# Patient Record
Sex: Female | Born: 1960 | Race: Asian | Hispanic: No | Smoking: Never smoker
Health system: Southern US, Community
[De-identification: ages and names within clinical notes are randomized; demographics above are authoritative.]

## PROBLEM LIST (undated history)

## (undated) DIAGNOSIS — Z0000 Encounter for general adult medical examination without abnormal findings: Secondary | ICD-10-CM

## (undated) DIAGNOSIS — M1990 Unspecified osteoarthritis, unspecified site: Secondary | ICD-10-CM

## (undated) DIAGNOSIS — E785 Hyperlipidemia, unspecified: Secondary | ICD-10-CM

## (undated) DIAGNOSIS — R1013 Epigastric pain: Secondary | ICD-10-CM

## (undated) HISTORY — DX: Hyperlipidemia, unspecified: E78.5

## (undated) HISTORY — DX: Unspecified osteoarthritis, unspecified site: M19.90

## (undated) HISTORY — PX: COSMETIC SURGERY: SHX468

## (undated) HISTORY — DX: Encounter for general adult medical examination without abnormal findings: Z00.00

## (undated) HISTORY — PX: OOPHORECTOMY: SHX86

## (undated) HISTORY — PX: BREAST SURGERY: SHX581

## (undated) HISTORY — DX: Epigastric pain: R10.13

---

## 1998-12-02 HISTORY — PX: BREAST SURGERY: SHX581

## 2010-07-10 ENCOUNTER — Ambulatory Visit: Payer: Self-pay | Admitting: Family Medicine

## 2010-07-10 DIAGNOSIS — M62838 Other muscle spasm: Secondary | ICD-10-CM | POA: Insufficient documentation

## 2010-07-10 DIAGNOSIS — M129 Arthropathy, unspecified: Secondary | ICD-10-CM | POA: Insufficient documentation

## 2010-07-10 DIAGNOSIS — M479 Spondylosis, unspecified: Secondary | ICD-10-CM | POA: Insufficient documentation

## 2010-07-11 ENCOUNTER — Ambulatory Visit: Payer: Self-pay | Admitting: Family Medicine

## 2010-07-11 LAB — CONVERTED CEMR LAB
Basophils Absolute: 0.1 10*3/uL (ref 0.0–0.1)
Basophils Relative: 1.2 % (ref 0.0–3.0)
Calcium: 9.4 mg/dL (ref 8.4–10.5)
Chloride: 100 meq/L (ref 96–112)
Creatinine, Ser: 0.5 mg/dL (ref 0.4–1.2)
Eosinophils Relative: 2.8 % (ref 0.0–5.0)
Lymphocytes Relative: 33.3 % (ref 12.0–46.0)
Lymphs Abs: 1.6 10*3/uL (ref 0.7–4.0)
MCV: 91.6 fL (ref 78.0–100.0)
Monocytes Absolute: 0.4 10*3/uL (ref 0.1–1.0)
Monocytes Relative: 8.8 % (ref 3.0–12.0)
Potassium: 4.1 meq/L (ref 3.5–5.1)
RBC: 3.96 M/uL (ref 3.87–5.11)
Sed Rate: 23 mm/hr — ABNORMAL HIGH (ref 0–22)
WBC: 4.7 10*3/uL (ref 4.5–10.5)
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
## 2011-08-02 ENCOUNTER — Other Ambulatory Visit: Payer: Self-pay | Admitting: Family Medicine

## 2014-11-07 ENCOUNTER — Other Ambulatory Visit: Payer: Self-pay | Admitting: Gastroenterology

## 2014-11-07 DIAGNOSIS — K219 Gastro-esophageal reflux disease without esophagitis: Secondary | ICD-10-CM

## 2014-11-11 ENCOUNTER — Encounter (INDEPENDENT_AMBULATORY_CARE_PROVIDER_SITE_OTHER): Payer: Self-pay

## 2014-11-11 ENCOUNTER — Ambulatory Visit
Admission: RE | Admit: 2014-11-11 | Discharge: 2014-11-11 | Disposition: A | Discharge status: Home / Self Care | Payer: BC Managed Care – PPO | Source: Ambulatory Visit | Attending: Gastroenterology | Admitting: Gastroenterology

## 2014-11-11 DIAGNOSIS — K219 Gastro-esophageal reflux disease without esophagitis: Secondary | ICD-10-CM

## 2015-07-06 ENCOUNTER — Other Ambulatory Visit: Payer: Self-pay | Admitting: Gastroenterology

## 2015-07-06 DIAGNOSIS — K7689 Other specified diseases of liver: Secondary | ICD-10-CM

## 2015-07-12 ENCOUNTER — Ambulatory Visit
Admission: RE | Admit: 2015-07-12 | Discharge: 2015-07-12 | Disposition: A | Discharge status: Home / Self Care | Payer: BLUE CROSS/BLUE SHIELD | Source: Ambulatory Visit | Attending: Gastroenterology | Admitting: Gastroenterology

## 2015-07-12 DIAGNOSIS — K7689 Other specified diseases of liver: Secondary | ICD-10-CM

## 2015-07-18 ENCOUNTER — Other Ambulatory Visit: Payer: Self-pay | Admitting: Gastroenterology

## 2015-07-18 DIAGNOSIS — K7689 Other specified diseases of liver: Secondary | ICD-10-CM

## 2015-07-26 ENCOUNTER — Ambulatory Visit
Admission: RE | Admit: 2015-07-26 | Discharge: 2015-07-26 | Disposition: A | Discharge status: Home / Self Care | Payer: BLUE CROSS/BLUE SHIELD | Source: Ambulatory Visit | Attending: Gastroenterology | Admitting: Gastroenterology

## 2015-07-26 DIAGNOSIS — K7689 Other specified diseases of liver: Secondary | ICD-10-CM

## 2015-07-26 MED ORDER — GADOBENATE DIMEGLUMINE 529 MG/ML IV SOLN
10.0000 mL | Freq: Once | INTRAVENOUS | Status: AC | PRN
  Administered 2015-07-26: 10 mL via INTRAVENOUS

## 2016-11-05 IMAGING — US US ABDOMEN COMPLETE
1 series · 13 of 25 positions shown · non-contrast
Comparison: 11/11/2014

CLINICAL DATA: Evaluate hepatic cyst. Patient complains of gas and
belching.

EXAM:
ULTRASOUND ABDOMEN COMPLETE

[Series 1: us abdomen complete · 0.28mm/px · 13 of 84 slices shown]
[im 1/84]
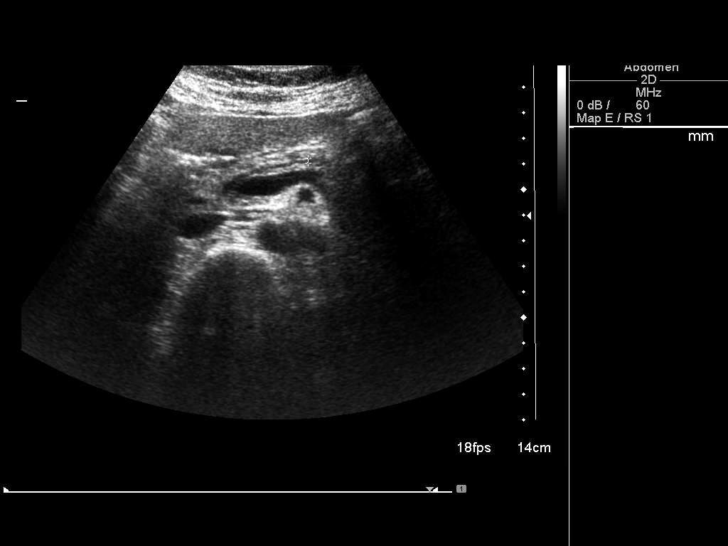
[im 7/84]
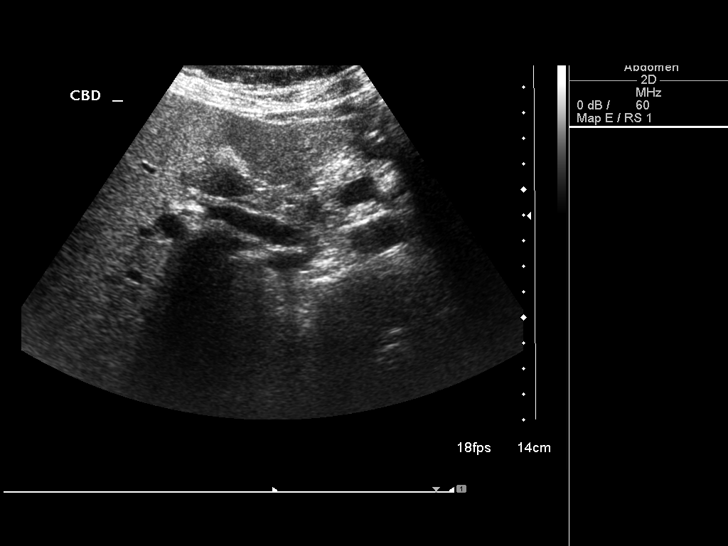
[im 14/84]
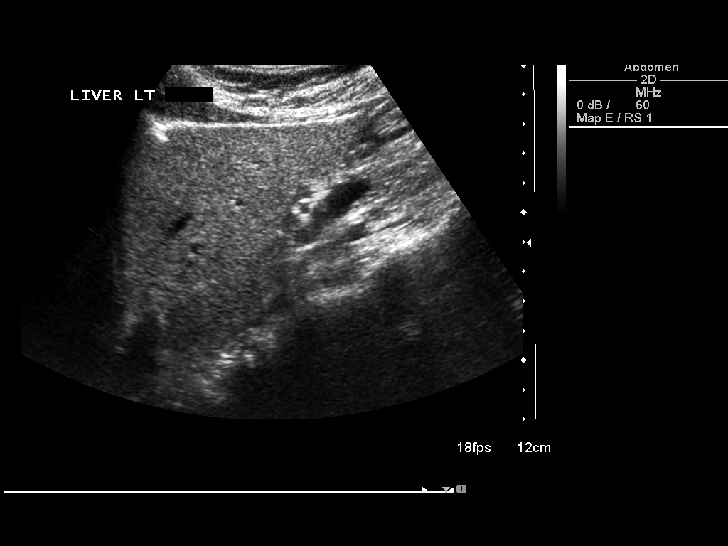
[im 21/84]
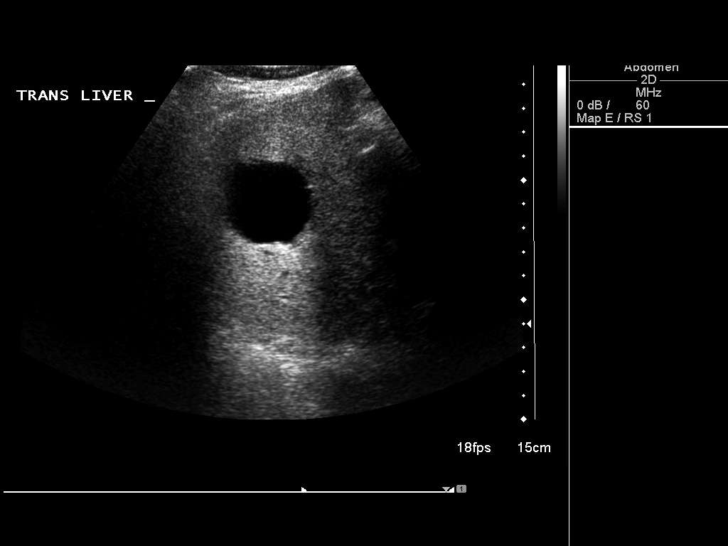
[im 28/84]
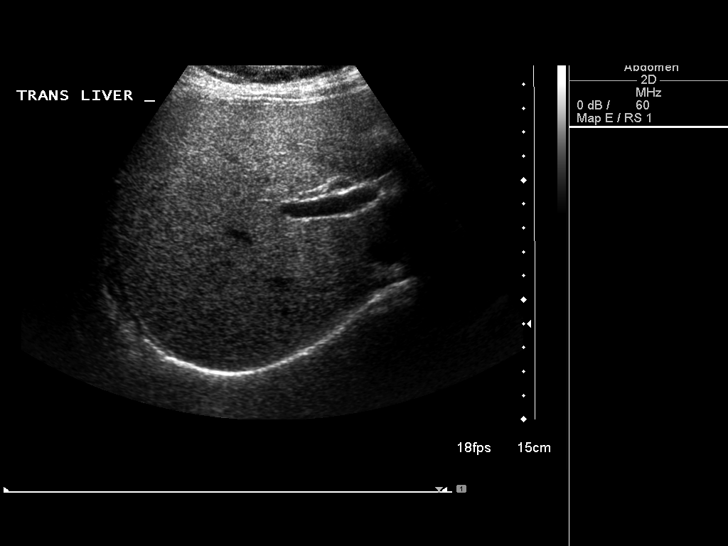
[im 35/84]
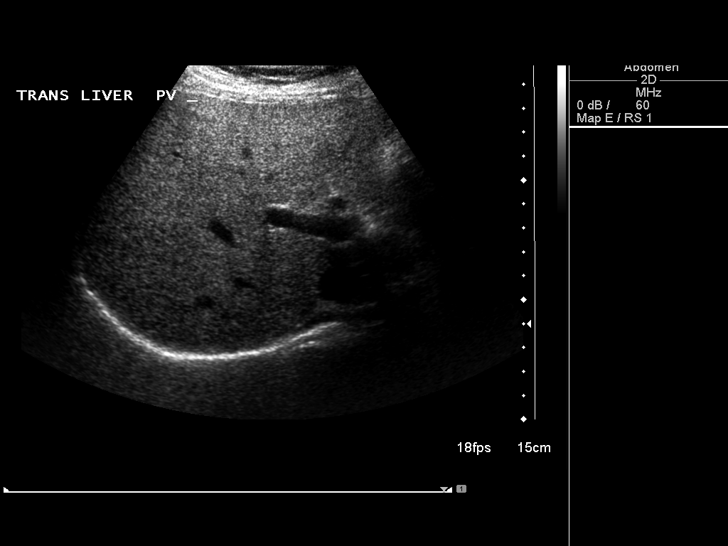
[im 42/84]
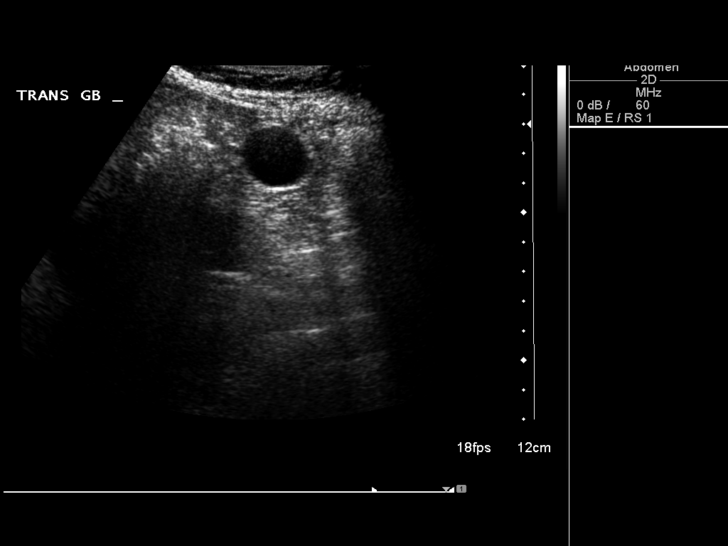
[im 49/84]
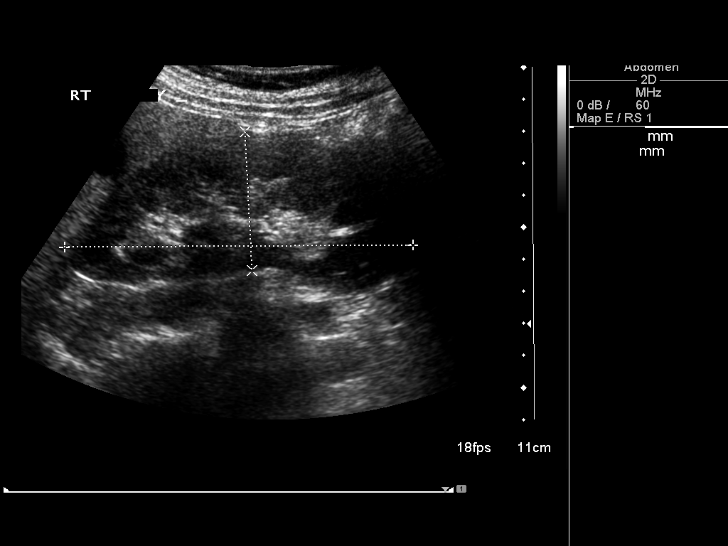
[im 56/84]
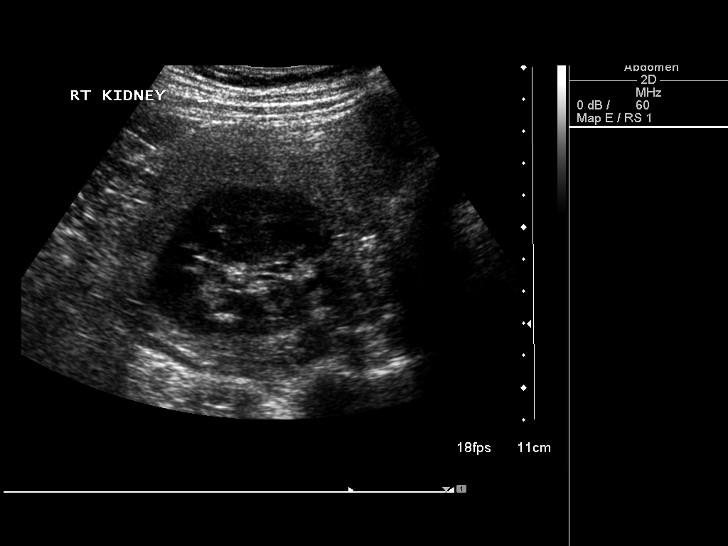
[im 63/84]
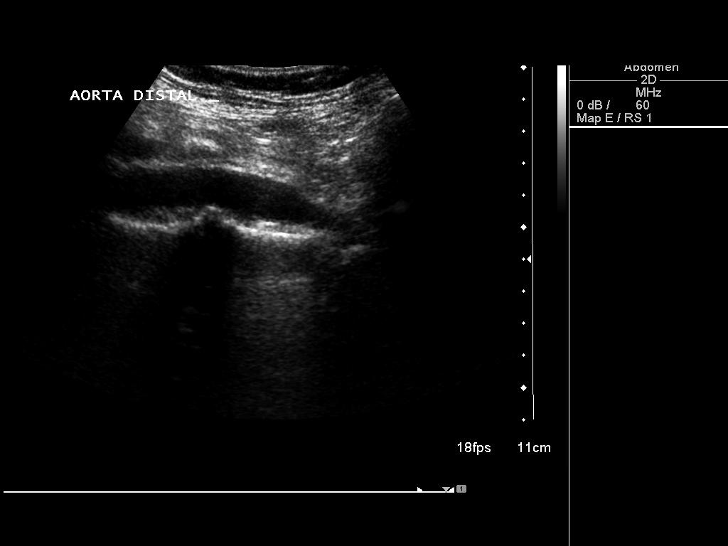
[im 70/84]
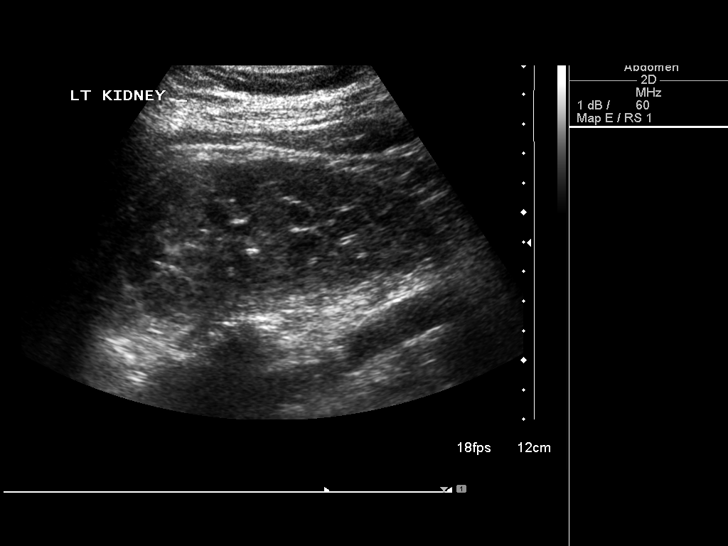
[im 77/84]
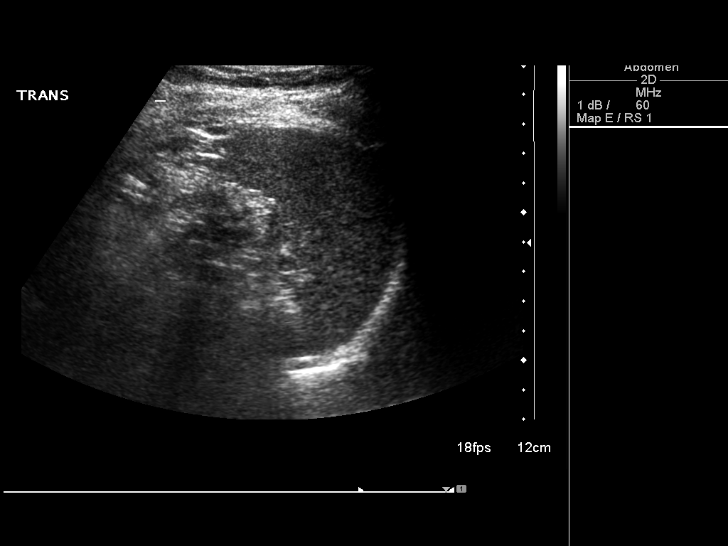
[im 84/84]
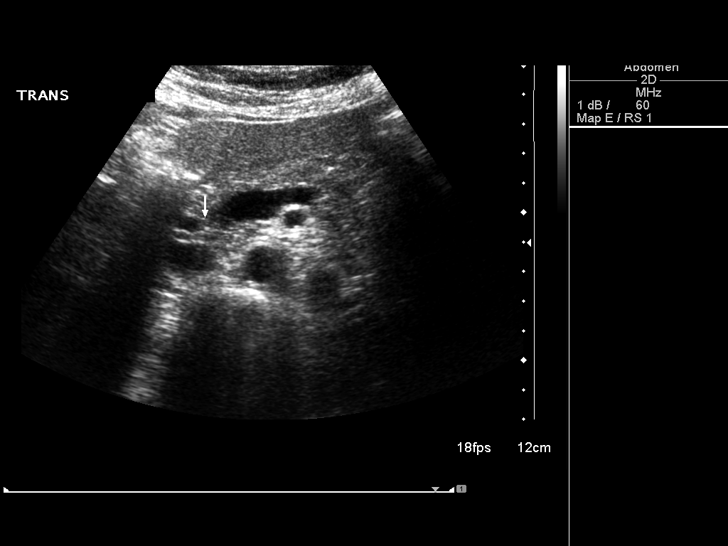

[13 of 25 positions shown; findings below may reference images not displayed]

FINDINGS: Gallbladder: No gallstones or wall thickening visualized. No
sonographic Murphy sign noted.

Common bile duct: Diameter: 0.9 cm. There is concern for some
echogenic material in the distal common bile duct.

Liver: Again noted is an anechoic structure in the right hepatic
lobe measuring 4.4 x 3.6 x 3.8 cm. Findings consistent with a simple
hepatic cyst. No other liver lesions. No significant intrahepatic
biliary dilatation. Main portal vein is patent.

IVC: No abnormality visualized.

Pancreas: Pancreatic duct appears to be prominent near the
pancreatic head measuring roughly 0.4 cm. No gross abnormality to
the visualized pancreatic parenchyma.

Spleen: Size and appearance within normal limits.

Right Kidney: Length: 10.9 cm. Echogenicity within normal limits. No
mass or hydronephrosis visualized.

Left Kidney: Length: 10.8 cm. Echogenicity within normal limits. No
mass or hydronephrosis visualized.

Abdominal aorta: No aneurysm visualized. Atherosclerotic disease in
the distal abdominal aorta.

Other findings: None.
IMPRESSION: No significant change in the right hepatic cyst.

Mild dilatation of the extrahepatic biliary system and pancreatic
duct. There is concern for echogenic material in the distal common
bile duct which could represent choledocholithiasis. These findings
could be better characterized with an MRI of the abdomen with MRCP.

## 2017-09-02 ENCOUNTER — Ambulatory Visit (INDEPENDENT_AMBULATORY_CARE_PROVIDER_SITE_OTHER): Payer: BLUE CROSS/BLUE SHIELD | Admitting: Family Medicine

## 2017-09-02 ENCOUNTER — Encounter: Payer: Self-pay | Admitting: Family Medicine

## 2017-09-02 VITALS — BP 118/71 | HR 68 | Temp 97.9°F | Resp 18 | Ht 62.0 in | Wt 122.0 lb

## 2017-09-02 DIAGNOSIS — E785 Hyperlipidemia, unspecified: Secondary | ICD-10-CM

## 2017-09-02 DIAGNOSIS — M479 Spondylosis, unspecified: Secondary | ICD-10-CM

## 2017-09-02 DIAGNOSIS — Z0000 Encounter for general adult medical examination without abnormal findings: Secondary | ICD-10-CM | POA: Diagnosis not present

## 2017-09-02 DIAGNOSIS — R51 Headache: Secondary | ICD-10-CM | POA: Diagnosis not present

## 2017-09-02 DIAGNOSIS — R079 Chest pain, unspecified: Secondary | ICD-10-CM | POA: Diagnosis not present

## 2017-09-02 DIAGNOSIS — K5900 Constipation, unspecified: Secondary | ICD-10-CM | POA: Diagnosis not present

## 2017-09-02 DIAGNOSIS — R1013 Epigastric pain: Secondary | ICD-10-CM

## 2017-09-02 DIAGNOSIS — R519 Headache, unspecified: Secondary | ICD-10-CM

## 2017-09-02 HISTORY — DX: Epigastric pain: R10.13

## 2017-09-02 MED ORDER — RANITIDINE HCL 300 MG PO CAPS: 300.0000 mg | ORAL_CAPSULE | Freq: Every evening | ORAL | 2 refills | Status: AC

## 2017-09-02 MED ORDER — TIZANIDINE HCL 4 MG PO TABS
4.0000 mg | ORAL_TABLET | Freq: Every evening | ORAL | 1 refills | Status: DC | PRN
Start: 2017-09-02 — End: 2017-09-19
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
## 2017-09-02 NOTE — Patient Instructions (Addendum)
Encouraged increased hydration and fiber in diet. Daily probiotics. If bowels not moving can use MOM 2 tbls po in 4 oz of warm prune juice by mouth every 2-3 days. If no results then repeat in 4 hours with  Dulcolax suppository pr, may repeat again in 4 more hours as needed. Seek care if symptoms worsen. Consider daily Miralax and/or Dulcolax if symptoms persist.    Helicobacter Pylori Infection Helicobacter pylori infection is an infection in the stomach that is caused by the Helicobacter pylori (H. pylori) bacteria. This type of bacteria often lives in the lining of the stomach. The infection can cause ulcers and irritation (gastritis) in some people. It is the most common cause of ulcers in the stomach (gastric ulcer) and in the upper part of the intestine (duodenal ulcer). Having this infection may also increase the risk of stomach cancer and a type of white blood cell cancer (lymphoma) that affects the stomach. What are the causes? H. pylori is a type of bacteria that is often found in the stomachs of healthy people. The bacteria may be passed from person to person through contact with stool or saliva. It is not known why some people develop ulcers, gastritis, or cancer from the infection. What increases the risk? This condition is more likely to develop in people who:  Have family members with the infection.  Live with many other people, such as in a dormitory.  Are of African, Hispanic, or Asian descent.  What are the signs or symptoms? Most people with this infection do not have symptoms. If you do have symptoms, they may include:  Heartburn.  Stomach pain.  Nausea.  Vomiting.  Blood-tinged vomit.  Loss of appetite.  Bad breath.  How is this diagnosed? This condition may be diagnosed based on your symptoms, a physical exam, and various tests. Tests may include:  Blood tests or stool tests to check for the proteins (antibodies) that your body may produce in response to the  bacteria. These tests are the best way to confirm the diagnosis.  A breath test to check for the type of gas that the H. pylori bacteria release after breaking down a substance called urea. For the test, you are asked to drink urea. This test is often done after treatment in order to find out if the treatment worked.  A procedure in which a thin, flexible tube with a tiny camera at the end is placed into your stomach and upper intestine (upper endoscopy). Your health care provider may also take tissue samples (biopsy) to test for H. pylori and cancer.  How is this treated? Treatment for this condition usually involves taking a combination of medicines (triple therapy) for a couple of weeks. Triple therapy includes one medicine to reduce the acid in your stomach and two types of antibiotic medicines. Many drug combinations have been approved for treatment. Treatment usually kills the H. pylori and reduces your risk of cancer. You may need to be tested for H. pylori again after treatment. In some cases, the treatment may need to be repeated. Follow these instructions at home:  Take over-the-counter and prescription medicines only as told by your health care provider.  Take your antibiotics as told by your health care provider. Do not stop taking the antibiotics even if you start to feel better.  You can do all your usual activities and eat what you usually do.  Take steps to prevent future infections: ? Wash your hands often. ? Make sure the food  you eat has been properly prepared. ? Drink water only from clean sources.  Keep all follow-up visits as told by your health care provider. This is important. Contact a health care provider if:  Your symptoms do not get better.  Your symptoms return after treatment. This information is not intended to replace advice given to you by your health care provider. Make sure you discuss any questions you have with your health care provider. Document  Released: 03/11/2016 Document Revised: 04/25/2016 Document Reviewed: 11/30/2014 Elsevier Interactive Patient Education  2018 [employer].

## 2017-09-03 LAB — COMPREHENSIVE METABOLIC PANEL
ALBUMIN: 4.4 g/dL (ref 3.5–5.2)
ALK PHOS: 54 U/L (ref 39–117)
ALT: 22 U/L (ref 0–35)
AST: 16 U/L (ref 0–37)
BUN: 13 mg/dL (ref 6–23)
CALCIUM: 9.5 mg/dL (ref 8.4–10.5)
CO2: 31 mEq/L (ref 19–32)
Chloride: 102 mEq/L (ref 96–112)
Creatinine, Ser: 0.7 mg/dL (ref 0.40–1.20)
GFR: 91.12 mL/min (ref >60.00)
Glucose, Bld: 84 mg/dL (ref 70–99)
POTASSIUM: 3.8 meq/L (ref 3.5–5.1)
Sodium: 140 mEq/L (ref 135–145)
TOTAL PROTEIN: 8 g/dL (ref 6.0–8.3)
Total Bilirubin: 0.6 mg/dL (ref 0.2–1.2)

## 2017-09-03 LAB — LIPID PANEL
Cholesterol: 199 mg/dL (ref 0–200)
HDL: 47.4 mg/dL (ref >39.00)
LDL Cholesterol: 122 mg/dL — ABNORMAL HIGH (ref 0–99)
NonHDL: 151.13
TRIGLYCERIDES: 144 mg/dL (ref 0.0–149.0)
Total CHOL/HDL Ratio: 4
VLDL: 28.8 mg/dL (ref 0.0–40.0)

## 2017-09-03 LAB — CBC
HEMATOCRIT: 39.1 % (ref 36.0–46.0)
Hemoglobin: 13 g/dL (ref 12.0–15.0)
MCHC: 33.4 g/dL (ref 30.0–36.0)
MCV: 98.4 fl (ref 78.0–100.0)
Platelets: 267 10*3/uL (ref 150.0–400.0)
RBC: 3.97 Mil/uL (ref 3.87–5.11)
RDW: 12.2 % (ref 11.5–15.5)
WBC: 7 10*3/uL (ref 4.0–10.5)

## 2017-09-03 LAB — TSH: TSH: 1.1 u[IU]/mL (ref 0.35–4.50)

## 2017-09-03 LAB — H. PYLORI ANTIBODY, IGG: H Pylori IgG: NEGATIVE

## 2017-09-03 LAB — T4, FREE: FREE T4: 1.01 ng/dL (ref 0.60–1.60)

## 2017-09-07 ENCOUNTER — Encounter: Payer: Self-pay | Admitting: Family Medicine

## 2017-09-07 DIAGNOSIS — Z0000 Encounter for general adult medical examination without abnormal findings: Secondary | ICD-10-CM

## 2017-09-07 DIAGNOSIS — E785 Hyperlipidemia, unspecified: Secondary | ICD-10-CM

## 2017-09-07 HISTORY — DX: Hyperlipidemia, unspecified: E78.5

## 2017-09-07 HISTORY — DX: Encounter for general adult medical examination without abnormal findings: Z00.00

## 2017-09-07 NOTE — Assessment & Plan Note (Signed)
Has had xrays done at the chiropractors office. will request copies.
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
## 2017-09-07 NOTE — Assessment & Plan Note (Signed)
Has seen Dr Marcelle Overlie of GYN for paps and [facility] will request records. Labs ordered today. Immunizations declined.

## 2017-09-07 NOTE — Assessment & Plan Note (Signed)
maintain heart healthy diet, increase exercise, avoid trans fats, consider a krill oil cap daily 
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
## 2017-09-09 ENCOUNTER — Ambulatory Visit (HOSPITAL_BASED_OUTPATIENT_CLINIC_OR_DEPARTMENT_OTHER)
Admission: RE | Admit: 2017-09-09 | Discharge: 2017-09-09 | Disposition: A | Discharge status: Home / Self Care | Payer: BLUE CROSS/BLUE SHIELD | Source: Ambulatory Visit | Attending: Family Medicine | Admitting: Family Medicine

## 2017-09-09 ENCOUNTER — Encounter: Payer: Self-pay | Admitting: Physician Assistant

## 2017-09-09 DIAGNOSIS — R079 Chest pain, unspecified: Secondary | ICD-10-CM | POA: Diagnosis present

## 2017-09-09 DIAGNOSIS — I700 Atherosclerosis of aorta: Secondary | ICD-10-CM | POA: Diagnosis not present

## 2017-09-19 ENCOUNTER — Encounter: Payer: Self-pay | Admitting: Physician Assistant

## 2017-09-19 ENCOUNTER — Ambulatory Visit (INDEPENDENT_AMBULATORY_CARE_PROVIDER_SITE_OTHER): Payer: BLUE CROSS/BLUE SHIELD | Admitting: Physician Assistant

## 2017-09-19 VITALS — BP 100/68 | HR 66 | Ht 62.0 in | Wt 122.1 lb

## 2017-09-19 DIAGNOSIS — R142 Eructation: Secondary | ICD-10-CM

## 2017-09-19 MED ORDER — OMEPRAZOLE 40 MG PO CPDR: 40.0000 mg | DELAYED_RELEASE_CAPSULE | ORAL | 3 refills | Status: AC

## 2017-09-19 NOTE — Patient Instructions (Signed)
We have sent the following medications to your pharmacy for you to pick up at your convenience:  Omeprazole 40 mg daily 30-60 minutes before breakfast.   

## 2017-09-19 NOTE — Progress Notes (Signed)
Reviewed and agree with initial management plan.  Micki Cassel T. Reham Slabaugh, MD FACG 
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
## 2017-11-13 ENCOUNTER — Ambulatory Visit: Payer: BLUE CROSS/BLUE SHIELD | Admitting: Family Medicine

## 2018-01-22 ENCOUNTER — Ambulatory Visit: Payer: BLUE CROSS/BLUE SHIELD | Admitting: Family Medicine

## 2018-01-22 ENCOUNTER — Telehealth: Payer: Self-pay

## 2018-01-22 NOTE — Telephone Encounter (Signed)
Medical Record Request form received from J. Olevia Perchesavid [place] Attorney at [place], form forwarded to [place], Research officer, political partypractice administrator for scanning/emailing.

## 2018-04-07 ENCOUNTER — Ambulatory Visit: Payer: BLUE CROSS/BLUE SHIELD | Attending: Orthopedic Surgery | Admitting: Physical Therapy

## 2018-04-07 ENCOUNTER — Encounter: Payer: Self-pay | Admitting: Physical Therapy

## 2018-04-07 DIAGNOSIS — M62838 Other muscle spasm: Secondary | ICD-10-CM | POA: Insufficient documentation

## 2018-04-07 DIAGNOSIS — M542 Cervicalgia: Secondary | ICD-10-CM | POA: Diagnosis not present
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
## 2018-04-14 ENCOUNTER — Ambulatory Visit: Payer: BLUE CROSS/BLUE SHIELD | Admitting: Physical Therapy

## 2018-04-14 DIAGNOSIS — M62838 Other muscle spasm: Secondary | ICD-10-CM

## 2018-04-14 DIAGNOSIS — M542 Cervicalgia: Secondary | ICD-10-CM | POA: Diagnosis not present
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
## 2018-04-16 ENCOUNTER — Ambulatory Visit: Payer: BLUE CROSS/BLUE SHIELD | Admitting: Physical Therapy

## 2018-04-16 ENCOUNTER — Other Ambulatory Visit: Payer: Self-pay

## 2018-04-16 DIAGNOSIS — M542 Cervicalgia: Secondary | ICD-10-CM | POA: Diagnosis not present

## 2018-04-16 DIAGNOSIS — M62838 Other muscle spasm: Secondary | ICD-10-CM
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
## 2018-04-21 ENCOUNTER — Ambulatory Visit: Payer: BLUE CROSS/BLUE SHIELD | Admitting: Physical Therapy

## 2018-04-21 ENCOUNTER — Encounter: Payer: Self-pay | Admitting: Physical Therapy

## 2018-04-21 DIAGNOSIS — M542 Cervicalgia: Secondary | ICD-10-CM | POA: Diagnosis not present

## 2018-04-21 DIAGNOSIS — M62838 Other muscle spasm: Secondary | ICD-10-CM
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
## 2018-04-23 ENCOUNTER — Ambulatory Visit: Payer: BLUE CROSS/BLUE SHIELD | Admitting: Physical Therapy

## 2018-04-23 DIAGNOSIS — M62838 Other muscle spasm: Secondary | ICD-10-CM

## 2018-04-23 DIAGNOSIS — M542 Cervicalgia: Secondary | ICD-10-CM | POA: Diagnosis not present
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
## 2018-04-29 ENCOUNTER — Encounter: Payer: Self-pay | Admitting: Physical Therapy

## 2018-04-29 ENCOUNTER — Ambulatory Visit: Payer: BLUE CROSS/BLUE SHIELD | Admitting: Physical Therapy

## 2018-04-29 DIAGNOSIS — M62838 Other muscle spasm: Secondary | ICD-10-CM

## 2018-04-29 DIAGNOSIS — M542 Cervicalgia: Secondary | ICD-10-CM
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
## 2018-05-05 ENCOUNTER — Ambulatory Visit: Payer: BLUE CROSS/BLUE SHIELD | Attending: Orthopedic Surgery | Admitting: Physical Therapy

## 2018-05-05 DIAGNOSIS — M542 Cervicalgia: Secondary | ICD-10-CM | POA: Insufficient documentation

## 2018-05-05 DIAGNOSIS — M62838 Other muscle spasm: Secondary | ICD-10-CM | POA: Diagnosis not present
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
## 2018-05-08 ENCOUNTER — Ambulatory Visit: Payer: BLUE CROSS/BLUE SHIELD | Admitting: Physical Therapy

## 2018-07-24 ENCOUNTER — Telehealth: Payer: Self-pay | Admitting: *Deleted

## 2018-07-24 NOTE — Telephone Encounter (Signed)
Received request for Medical Records from J. Olevia Perchesavid [place] [facility] [employer]; forwarded to [place] for email/scan/SLS 08/23
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
## 2020-01-28 ENCOUNTER — Ambulatory Visit: Payer: BLUE CROSS/BLUE SHIELD | Attending: Internal Medicine

## 2020-01-28 DIAGNOSIS — Z23 Encounter for immunization: Secondary | ICD-10-CM | POA: Insufficient documentation

## 2020-01-28 NOTE — Progress Notes (Signed)
   Covid-19 Vaccination Clinic  Name:  Karen Nicholson    MRN: [id number] DOB: 06-Dec-1960  01/28/2020  [name] was observed post Covid-19 immunization for 15 minutes without incidence. She was provided with Vaccine Information Sheet and instruction to access the V-Safe system.   [name] was instructed to call 911 with any severe reactions post vaccine: Marland Kitchen Difficulty breathing  . Swelling of your face and throat  . A fast heartbeat  . A bad rash all over your body  . Dizziness and weakness    Immunizations Administered    Name Date Dose VIS Date Route   Pfizer COVID-19 Vaccine 01/28/2020  8:25 AM 0.3 mL 11/12/2019 Intramuscular   Manufacturer: [employer], [employer]   Lot: [id number]   NDC: [address]-8

## 2020-02-19 ENCOUNTER — Ambulatory Visit: Payer: BLUE CROSS/BLUE SHIELD | Attending: Internal Medicine

## 2020-02-19 DIAGNOSIS — Z23 Encounter for immunization: Secondary | ICD-10-CM

## 2020-02-19 NOTE — Progress Notes (Signed)
   Covid-19 Vaccination Clinic  Name:  Karen Nicholson    MRN: [id number] DOB: [birth year]  02/19/2020  [name] was observed post Covid-19 immunization for 15 minutes without incident. She was provided with Vaccine Information Sheet and instruction to access the V-Safe system.   [name] was instructed to call 911 with any severe reactions post vaccine: Marland Kitchen Difficulty breathing  . Swelling of face and throat  . A fast heartbeat  . A bad rash all over body  . Dizziness and weakness   Immunizations Administered    Name Date Dose VIS Date Route   Pfizer COVID-19 Vaccine 02/19/2020 10:12 AM 0.3 mL 11/12/2019 Intramuscular   Manufacturer: [employer], [employer]   Lot: [id number]   NDC: [address]-1

## 2020-02-23 ENCOUNTER — Ambulatory Visit: Payer: BLUE CROSS/BLUE SHIELD

## 2022-03-14 ENCOUNTER — Ambulatory Visit: Payer: Self-pay | Admitting: Family Medicine

## 2022-03-21 ENCOUNTER — Encounter: Payer: Self-pay | Admitting: Family Medicine

## 2022-03-21 ENCOUNTER — Ambulatory Visit: Payer: 59 | Admitting: Family Medicine

## 2022-03-21 VITALS — BP 102/59 | HR 61 | Ht 62.0 in | Wt 127.2 lb

## 2022-03-21 DIAGNOSIS — R631 Polydipsia: Secondary | ICD-10-CM | POA: Diagnosis not present

## 2022-03-21 DIAGNOSIS — Z1211 Encounter for screening for malignant neoplasm of colon: Secondary | ICD-10-CM

## 2022-03-21 DIAGNOSIS — R69 Illness, unspecified: Secondary | ICD-10-CM | POA: Diagnosis not present

## 2022-03-21 DIAGNOSIS — E7800 Pure hypercholesterolemia, unspecified: Secondary | ICD-10-CM | POA: Diagnosis not present

## 2022-03-21 DIAGNOSIS — Z1231 Encounter for screening mammogram for malignant neoplasm of breast: Secondary | ICD-10-CM | POA: Diagnosis not present

## 2022-03-21 DIAGNOSIS — Z1159 Encounter for screening for other viral diseases: Secondary | ICD-10-CM

## 2022-03-21 DIAGNOSIS — Z114 Encounter for screening for human immunodeficiency virus [HIV]: Secondary | ICD-10-CM | POA: Diagnosis not present

## 2022-03-21 DIAGNOSIS — R6889 Other general symptoms and signs: Secondary | ICD-10-CM | POA: Diagnosis not present

## 2022-03-21 DIAGNOSIS — Z23 Encounter for immunization: Secondary | ICD-10-CM

## 2022-03-21 NOTE — Patient Instructions (Signed)
Thank you for choosing [facility] for your Primary Care needs. I am excited for the opportunity to partner with you to meet your health care goals. It was a pleasure meeting you today! ? ? ? ?Information on diet, exercise, and health maintenance recommendations are listed below. This is information to help you be sure you are on track for optimal health and monitoring.  ? ?Please look over this and let us know if you have any questions or if you have completed any of the health maintenance outside of [facility] so that we can be sure your records are up to date.  ?___________________________________________________________ ? ?MyChart:  ?For all urgent or time sensitive needs we ask that you please call the office to avoid delays. Our number is [phone]. ?MyChart is not constantly monitored and due to the large volume of messages a day, replies may take up to 72 business hours. ? ?MyChart Policy: ?MyChart allows for you to see your visit notes, after visit summary, provider recommendations, lab and tests results, make an appointment, request refills, and contact your provider or the office for non-urgent questions or concerns. Providers are seeing patients during normal business hours and do not have built in time to review MyChart messages.  ?We ask that you allow a minimum of 3 business days for responses to MyChart messages. For this reason, please do not send urgent requests through MyChart. Please call the office at [phone]. ?New and ongoing conditions may require a visit. We have virtual and in-person visits available for your convenience.  ?Complex MyChart concerns may require a visit. Your provider may request you schedule a virtual or in-person visit to ensure we are providing the best care possible. ?MyChart messages sent after 11:00 AM on Friday will not be received by the provider until Monday morning.  ?  ?Lab and Test Results: ?You will receive your lab and  test results on MyChart as soon as they are completed and results have been sent by the lab or testing facility. Due to this service, you will receive your results BEFORE your provider.  ?I review lab and test results each morning prior to seeing patients. Some results require collaboration with other providers to ensure you are receiving the most appropriate care. For this reason, we ask that you please allow a minimum of 3-5 business days from the time that ALL results have been received for your provider to receive and review lab and test results and contact you about these.  ?Most lab and test result comments from the provider will be sent through MyChart. Your provider may recommend changes to the plan of care, follow-up visits, repeat testing, ask questions, or request an office visit to discuss these results. You may reply directly to this message or call the office to provide information for the provider or set up an appointment. ?In some instances, you will be called with test results and recommendations. Please let us know if this is preferred and we will make note of this in your chart to provide this for you.    ?If you have not heard a response to your lab or test results in 5 business days from all results returning to MyChart, please call the office to let us know. We ask that you please avoid calling prior to this time unless there is an emergent concern. Due to high call volumes, this can delay the resulting process. ? ?After Hours: ?For all non-emergency after hours   needs, please call the office at [phone] and select the option to reach the on-call  service. On-call services are shared between multiple [place] offices and therefore it will not be possible to speak directly with your provider. On-call providers may provide medical advice and recommendations, but are unable to provide refills for maintenance medications.  ?For all emergency or urgent medical needs after normal business  hours, we recommend that you seek care at the closest Urgent Care or Emergency Department to ensure appropriate treatment in a timely manner.  ?[facility] has a 24 hour emergency room located on the ground floor for your convenience.  ? ?Urgent Concerns During the Business Day ?Providers are seeing patients from 8AM to 5PM with a busy schedule and are most often not able to respond to non-urgent calls until the end of the day or the next business day. ?If you should have URGENT concerns during the day, please call and speak to the nurse or schedule a same day appointment so that we can address your concern without delay.  ? ?Thank you, again, for choosing me as your health care partner. I appreciate your trust and look forward to learning more about you.  ? ?Kamilah Correia B. Haly Feher, DNP, FNP-C ? ?___________________________________________________________ ? ?Health Maintenance Recommendations ?Screening Testing ?Mammogram ?Every 1-2 years based on history and risk factors ?Starting at age 50 ?Pap Smear ?Ages 21-39 every 3 years ?Ages 30-65 every 5 years with HPV testing ?More frequent testing may be required based on results and history ?Colon Cancer Screening ?Every 1-10 years based on test performed, risk factors, and history ?Starting at age 45 ?Bone Density Screening ?Every 2-10 years based on history ?Starting at age 65 for women ?Recommendations for men differ based on medication usage, history, and risk factors ?AAA Screening ?One time ultrasound ?Men 65-75 years old who have ever smoked ?Lung Cancer Screening ?Low Dose Lung CT every 12 months ?Age 50-80 years with a 20 pack-year smoking history who still smoke or who have quit within the last 15 years ? ?Screening Labs ?Routine  Labs: Complete Blood Count (CBC), Complete Metabolic Panel (CMP), Cholesterol (Lipid Panel) ?Every 6-12 months based on history and medications ?May be recommended more frequently based on current conditions or  previous results ?Hemoglobin A1c Lab ?Every 3-12 months based on history and previous results ?Starting at age 45 or earlier with diagnosis of diabetes, high cholesterol, BMI >26, and/or risk factors ?Frequent monitoring for patients with diabetes to ensure blood sugar control ?Thyroid Panel (TSH w/ T3 & T4) ?Every 6 months based on history, symptoms, and risk factors ?May be repeated more often if on medication ?HIV ?One time testing for all patients 13 and older ?May be repeated more frequently for patients with increased risk factors or exposure ?Hepatitis C ?One time testing for all patients 18 and older ?May be repeated more frequently for patients with increased risk factors or exposure ?Gonorrhea, Chlamydia ?Every 12 months for all sexually active persons 13-24 years ?Additional monitoring may be recommended for those who are considered high risk or who have symptoms ?PSA ?Men 40-54 years old with risk factors ?Additional screening may be recommended from age 55-69 based on risk factors, symptoms, and history ? ?Vaccine Recommendations ?Tetanus Booster ?All adults every 10 years ?Flu Vaccine ?All patients 6 months and older every year ?COVID Vaccine ?All patients 12 years and older ?Initial dosing with booster ?May recommend additional booster based on age and health history ?HPV Vaccine ?2 doses all   patients age 9-26 ?Dosing may be considered for patients over 26 ?Shingles Vaccine (Shingrix) ?2 doses all adults 50 years and older ?Pneumonia (Pneumovax 23) ?All adults 65 years and older ?May recommend earlier dosing based on health history ?Pneumonia (Prevnar 13) ?All adults 65 years and older ?Dosed 1 year after Pneumovax 23 ?Pneumonia (Prevnar 20) ?All adults 65 years and older (adults 19-64 with certain conditions or risk factors) ?1 dose  ?For those who have no received Prevnar 13 vaccine previously ? ? ?Additional Screening, Testing, and Vaccinations may be recommended on an individualized basis based on  family history, health history, risk factors, and/or exposure.  ?__________________________________________________________ ? ?Diet Recommendations for All Patients ? ?I recommend that all patients maintain a di
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
## 2022-03-22 ENCOUNTER — Encounter: Payer: Self-pay | Admitting: *Deleted

## 2022-03-22 LAB — URINALYSIS, ROUTINE W REFLEX MICROSCOPIC
Bilirubin Urine: NEGATIVE
Hgb urine dipstick: NEGATIVE
Ketones, ur: NEGATIVE
Leukocytes,Ua: NEGATIVE
Nitrite: NEGATIVE
RBC / HPF: NONE SEEN (ref 0–?)
Specific Gravity, Urine: <=1.005 — AB (ref 1.000–1.030)
Total Protein, Urine: NEGATIVE
Urine Glucose: NEGATIVE
Urobilinogen, UA: 0.2 (ref 0.0–1.0)
WBC, UA: NONE SEEN (ref 0–?)
pH: 7 (ref 5.0–8.0)

## 2022-03-22 LAB — HEPATITIS C ANTIBODY
Hepatitis C Ab: NONREACTIVE
SIGNAL TO CUT-OFF: 0.12 (ref <1.00)

## 2022-03-22 LAB — COMPREHENSIVE METABOLIC PANEL
ALT: 26 U/L (ref 0–35)
AST: 21 U/L (ref 0–37)
Albumin: 4.5 g/dL (ref 3.5–5.2)
Alkaline Phosphatase: 60 U/L (ref 39–117)
BUN: 11 mg/dL (ref 6–23)
CO2: 29 mEq/L (ref 19–32)
Calcium: 9.6 mg/dL (ref 8.4–10.5)
Chloride: 101 mEq/L (ref 96–112)
Creatinine, Ser: 0.66 mg/dL (ref 0.40–1.20)
GFR: 93.46 mL/min (ref >60.00)
Glucose, Bld: 74 mg/dL (ref 70–99)
Potassium: 3.8 mEq/L (ref 3.5–5.1)
Sodium: 140 mEq/L (ref 135–145)
Total Bilirubin: 0.8 mg/dL (ref 0.2–1.2)
Total Protein: 8 g/dL (ref 6.0–8.3)

## 2022-03-22 LAB — CBC
HCT: 38.9 % (ref 36.0–46.0)
Hemoglobin: 12.9 g/dL (ref 12.0–15.0)
MCHC: 33.2 g/dL (ref 30.0–36.0)
MCV: 96.3 fl (ref 78.0–100.0)
Platelets: 208 10*3/uL (ref 150.0–400.0)
RBC: 4.04 Mil/uL (ref 3.87–5.11)
RDW: 12.9 % (ref 11.5–15.5)
WBC: 4.7 10*3/uL (ref 4.0–10.5)

## 2022-03-22 LAB — LIPID PANEL
Cholesterol: 175 mg/dL (ref 0–200)
HDL: 46.5 mg/dL (ref >39.00)
LDL Cholesterol: 108 mg/dL — ABNORMAL HIGH (ref 0–99)
NonHDL: 128.66
Total CHOL/HDL Ratio: 4
Triglycerides: 102 mg/dL (ref 0.0–149.0)
VLDL: 20.4 mg/dL (ref 0.0–40.0)

## 2022-03-22 LAB — HEMOGLOBIN A1C: Hgb A1c MFr Bld: 5.2 % (ref 4.6–6.5)

## 2022-03-22 LAB — TSH: TSH: 0.66 u[IU]/mL (ref 0.35–5.50)

## 2022-03-22 LAB — HIV ANTIBODY (ROUTINE TESTING W REFLEX): HIV 1&2 Ab, 4th Generation: NONREACTIVE

## 2022-04-23 ENCOUNTER — Other Ambulatory Visit (HOSPITAL_COMMUNITY)
Admission: RE | Admit: 2022-04-23 | Discharge: 2022-04-23 | Disposition: A | Discharge status: Home / Self Care | Payer: 59 | Source: Ambulatory Visit | Attending: Family Medicine | Admitting: Family Medicine

## 2022-04-23 ENCOUNTER — Encounter: Payer: Self-pay | Admitting: Family Medicine

## 2022-04-23 ENCOUNTER — Ambulatory Visit (INDEPENDENT_AMBULATORY_CARE_PROVIDER_SITE_OTHER): Payer: 59 | Admitting: Family Medicine

## 2022-04-23 VITALS — BP 104/64 | HR 71 | Temp 98.3°F | Resp 16 | Ht 62.0 in | Wt 127.6 lb

## 2022-04-23 DIAGNOSIS — Z124 Encounter for screening for malignant neoplasm of cervix: Secondary | ICD-10-CM

## 2022-04-23 DIAGNOSIS — Z0000 Encounter for general adult medical examination without abnormal findings: Secondary | ICD-10-CM | POA: Diagnosis not present
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
## 2022-04-25 LAB — CYTOLOGY - PAP
Comment: NEGATIVE
Diagnosis: NEGATIVE
High risk HPV: NEGATIVE

## 2023-07-25 ENCOUNTER — Telehealth: Payer: Self-pay | Admitting: Family Medicine

## 2023-07-25 NOTE — Telephone Encounter (Signed)
Patient last seen in 2023- unable to speak with patient to schedule annual physical need updated number

## 2024-04-21 ENCOUNTER — Other Ambulatory Visit: Payer: Self-pay | Admitting: Family Medicine

## 2024-04-21 DIAGNOSIS — Z1231 Encounter for screening mammogram for malignant neoplasm of breast: Secondary | ICD-10-CM

## 2024-05-25 ENCOUNTER — Other Ambulatory Visit (HOSPITAL_BASED_OUTPATIENT_CLINIC_OR_DEPARTMENT_OTHER): Payer: Self-pay | Admitting: Family Medicine

## 2024-05-25 DIAGNOSIS — E559 Vitamin D deficiency, unspecified: Secondary | ICD-10-CM

## 2024-06-10 ENCOUNTER — Ambulatory Visit
Admission: RE | Admit: 2024-06-10 | Discharge: 2024-06-10 | Disposition: A | Discharge status: Home / Self Care | Source: Ambulatory Visit | Attending: Family Medicine | Admitting: Family Medicine

## 2024-06-10 DIAGNOSIS — Z1231 Encounter for screening mammogram for malignant neoplasm of breast: Secondary | ICD-10-CM

## 2025-01-26 ENCOUNTER — Ambulatory Visit: Admitting: Dermatology
